# Patient Record
Sex: Female | Born: 1995 | Race: Black or African American | Hispanic: No | Marital: Single | Smoking: Never smoker
Health system: Southern US, Community
[De-identification: ages and names within clinical notes are randomized; demographics above are authoritative.]

## PROBLEM LIST (undated history)

## (undated) DIAGNOSIS — K5900 Constipation, unspecified: Secondary | ICD-10-CM

## (undated) DIAGNOSIS — J45909 Unspecified asthma, uncomplicated: Secondary | ICD-10-CM

## (undated) DIAGNOSIS — E669 Obesity, unspecified: Secondary | ICD-10-CM

## (undated) HISTORY — DX: Obesity, unspecified: E66.9

---
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
## 2014-06-28 ENCOUNTER — Emergency Department (HOSPITAL_BASED_OUTPATIENT_CLINIC_OR_DEPARTMENT_OTHER): Payer: Medicaid Other

## 2014-06-28 ENCOUNTER — Emergency Department (HOSPITAL_BASED_OUTPATIENT_CLINIC_OR_DEPARTMENT_OTHER)
Admission: EM | Admit: 2014-06-28 | Discharge: 2014-06-28 | Disposition: A | Discharge status: Home / Self Care | Payer: Medicaid Other | Attending: Emergency Medicine | Admitting: Emergency Medicine

## 2014-06-28 ENCOUNTER — Encounter (HOSPITAL_BASED_OUTPATIENT_CLINIC_OR_DEPARTMENT_OTHER): Payer: Self-pay | Admitting: Emergency Medicine

## 2014-06-28 DIAGNOSIS — E669 Obesity, unspecified: Secondary | ICD-10-CM | POA: Diagnosis not present

## 2014-06-28 DIAGNOSIS — J45909 Unspecified asthma, uncomplicated: Secondary | ICD-10-CM | POA: Insufficient documentation

## 2014-06-28 DIAGNOSIS — Z79899 Other long term (current) drug therapy: Secondary | ICD-10-CM | POA: Diagnosis not present

## 2014-06-28 DIAGNOSIS — R079 Chest pain, unspecified: Secondary | ICD-10-CM | POA: Diagnosis not present

## 2014-06-28 HISTORY — DX: Unspecified asthma, uncomplicated: J45.909

## 2014-06-28 MED ORDER — OMEPRAZOLE 20 MG PO CPDR: 20.0000 mg | DELAYED_RELEASE_CAPSULE | Freq: Every day | ORAL | Status: AC

## 2014-06-28 NOTE — ED Notes (Signed)
Pt reports (R) sided CP.  Has has hx of same-has been evaluated multiple times with no explanation of pain.  Denies N/V.  Reports SOB

## 2014-06-28 NOTE — Discharge Instructions (Signed)

## 2014-06-28 NOTE — ED Provider Notes (Signed)
CSN: [id number]     Arrival date & time 06/28/14  2024 History   First MD Initiated Contact with Patient 06/28/14 2142     Chief Complaint  Patient presents with  . Chest Pain     (Consider location/radiation/quality/duration/timing/severity/associated sxs/prior Treatment) Patient is a 18 y.o. female presenting with chest pain. The history is provided by the patient. No language interpreter was used.  Chest Pain Pain quality: sharp   Pain radiates to the back: no   Relieved by:  Aspirin Associated symptoms: no abdominal pain, no cough, no fever, no nausea, no shortness of breath and not vomiting   Associated symptoms comment:  Chest pain she describes as sharp, brief, fleeting pain that changes locations in her chest. No radiation outside the chest. Symptoms started today, however, she reports months of similar symptoms intermittently. No cough, SOB, fever, nausea. She cannot identify any pattern of aggravating factors.    Past Medical History  Diagnosis Date  . Obesity   . Asthma    History reviewed. No pertinent past surgical history. Family History  Problem Relation Age of Onset  . Asthma Other   . Vision loss Other    History  Substance Use Topics  . Smoking status: Never Smoker   . Smokeless tobacco: Not on file  . Alcohol Use: No   OB History   Grav Para Term Preterm Abortions TAB SAB Ect Mult Living                 Review of Systems  Constitutional: Negative for fever and chills.  HENT: Negative.   Respiratory: Negative.  Negative for cough and shortness of breath.   Cardiovascular: Positive for chest pain.  Gastrointestinal: Negative.  Negative for nausea, vomiting and abdominal pain.  Musculoskeletal: Negative.  Negative for myalgias.  Skin: Negative.   Neurological: Negative.       Allergies  Shellfish allergy and Wheat bran  Home Medications   Prior to Admission medications   Medication Sig Start Date End Date Taking? Authorizing Provider   ALBUTEROL IN Inhale into the lungs as needed.    Historical Provider, MD   BP 129/58  Pulse 70  Temp(Src) 98.4 F (36.9 C) (Oral)  Resp 18  Ht 5\' 4"  (1.626 m)  Wt 250 lb (113.399 kg)  BMI 42.89 kg/m2  SpO2 98%  LMP 06/14/2014 Physical Exam  Constitutional: She is oriented to person, place, and time. She appears well-developed and well-nourished.  HENT:  Head: Normocephalic.  Neck: Normal range of motion. Neck supple.  Cardiovascular: Normal rate and regular rhythm.   Pulmonary/Chest: Effort normal and breath sounds normal. She has no wheezes. She has no rales. She exhibits no tenderness.  Abdominal: Soft. Bowel sounds are normal. There is no tenderness. There is no rebound and no guarding.  Musculoskeletal: Normal range of motion.  Neurological: She is alert and oriented to person, place, and time.  Skin: Skin is warm and dry. No rash noted.  Psychiatric: She has a normal mood and affect.    ED Course  Procedures (including critical care time) Labs Review Labs Reviewed - No data to display  Imaging Review No results found.   EKG Interpretation   Date/Time:  Wednesday June 28 2014 20:42:31 EDT Ventricular Rate:  82 PR Interval:  130 QRS Duration: 86 QT Interval:  360 QTC Calculation: 420 R Axis:   72 Text Interpretation:  Normal sinus rhythm Normal ECG No old tracing to  compare Confirmed by GOLDSTON  MD, SCOTT (  [id number]) on 06/28/2014 8:53:03 PM      MDM   Final diagnoses:  None    1. Chest pain, intermittent, chronic  No symptoms concerning for MI, PE or PNA. Neg EKG and CXR. Symptoms longstanding. Will refer back to PCP for further outpatient evaluation.    Arnoldo Hooker, PA-C 06/28/14 2228

## 2014-06-28 NOTE — ED Notes (Signed)
C/o rt side cp x 1.5 hours  Increased w laughing, and deep breath   Has been seen for same

## 2014-06-29 NOTE — ED Provider Notes (Signed)
Medical screening examination/treatment/procedure(s) were performed by non-physician practitioner and as supervising physician I was immediately available for consultation/collaboration.   EKG Interpretation   Date/Time:  Wednesday June 28 2014 20:42:31 EDT Ventricular Rate:  82 PR Interval:  130 QRS Duration: 86 QT Interval:  360 QTC Calculation: 420 R Axis:   72 Text Interpretation:  Normal sinus rhythm Normal ECG No old tracing to  compare Confirmed by Khylen Riolo  MD, Jaslynn Thome ([id number]) on 06/28/2014 8:53:03 PM        Audree CamelScott T Bonnye Halle, MD 06/29/14 [phone]

## 2015-02-15 ENCOUNTER — Encounter (HOSPITAL_COMMUNITY): Payer: Self-pay | Admitting: Emergency Medicine

## 2015-02-15 ENCOUNTER — Emergency Department (HOSPITAL_COMMUNITY): Payer: Medicaid Other

## 2015-02-15 DIAGNOSIS — Z8739 Personal history of other diseases of the musculoskeletal system and connective tissue: Secondary | ICD-10-CM

## 2015-02-15 DIAGNOSIS — R079 Chest pain, unspecified: Principal | ICD-10-CM

## 2015-02-15 DIAGNOSIS — J45909 Unspecified asthma, uncomplicated: Secondary | ICD-10-CM

## 2015-02-15 LAB — I-STAT TROPONIN, ED: TROPONIN I, POC: 0 ng/mL (ref 0.00–0.08)

## 2015-02-15 NOTE — ED Notes (Addendum)
Pt. reports central chest tightness with left jaw pain/mild SOB  and back pain onset this week . Denies emesis or diaphoresis . No cough or congestion .

## 2015-02-16 ENCOUNTER — Emergency Department (HOSPITAL_COMMUNITY)
Admission: EM | Admit: 2015-02-16 | Discharge: 2015-02-16 | Disposition: A | Discharge status: Home / Self Care | Payer: Medicaid Other | Attending: Emergency Medicine | Admitting: Emergency Medicine

## 2015-02-16 DIAGNOSIS — R0789 Other chest pain: Secondary | ICD-10-CM

## 2015-02-16 HISTORY — DX: Unspecified asthma, uncomplicated: J45.909

## 2015-02-16 LAB — BASIC METABOLIC PANEL
Anion gap: 6 (ref 5–15)
BUN: 8 mg/dL (ref 6–20)
CO2: 25 mmol/L (ref 22–32)
CREATININE: 1.01 mg/dL — AB (ref 0.44–1.00)
Calcium: 9.2 mg/dL (ref 8.9–10.3)
Chloride: 104 mmol/L (ref 101–111)
GFR calc Af Amer: >60 mL/min (ref >60)
GLUCOSE: 91 mg/dL (ref 65–99)
Potassium: 3.5 mmol/L (ref 3.5–5.1)
Sodium: 135 mmol/L (ref 135–145)

## 2015-02-16 LAB — CBC
HCT: 36.8 % (ref 36.0–46.0)
Hemoglobin: 12.6 g/dL (ref 12.0–15.0)
MCH: 29.4 pg (ref 26.0–34.0)
MCHC: 34.2 g/dL (ref 30.0–36.0)
MCV: 86 fL (ref 78.0–100.0)
PLATELETS: 281 10*3/uL (ref 150–400)
RBC: 4.28 MIL/uL (ref 3.87–5.11)
RDW: 13.8 % (ref 11.5–15.5)
WBC: 7.9 10*3/uL (ref 4.0–10.5)

## 2015-02-16 LAB — BRAIN NATRIURETIC PEPTIDE: B NATRIURETIC PEPTIDE 5: 22.1 pg/mL (ref 0.0–100.0)

## 2015-02-16 NOTE — Discharge Instructions (Signed)
Chest Pain (Nonspecific) Andrea Osborne, your chest xray, EKG, and heart markers were all normal.  See a primary care doctor within 3 days for your chest pain.  If symptoms worsen, come back to the ED immediately.  Thank you. It is often hard to give a diagnosis for the cause of chest pain. There is always a chance that your pain could be related to something serious, such as a heart attack or a blood clot in the lungs. You need to follow up with your doctor. HOME CARE  If antibiotic medicine was given, take it as directed by your doctor. Finish the medicine even if you start to feel better.  For the next few days, avoid activities that bring on chest pain. Continue physical activities as told by your doctor.  Do not use any tobacco products. This includes cigarettes, chewing tobacco, and e-cigarettes.  Avoid drinking alcohol.  Only take medicine as told by your doctor.  Follow your doctor's suggestions for more testing if your chest pain does not go away.  Keep all doctor visits you made. GET HELP IF:  Your chest pain does not go away, even after treatment.  You have a rash with blisters on your chest.  You have a fever. GET HELP RIGHT AWAY IF:   You have more pain or pain that spreads to your arm, neck, jaw, back, or belly (abdomen).  You have shortness of breath.  You cough more than usual or cough up blood.  You have very bad back or belly pain.  You feel sick to your stomach (nauseous) or throw up (vomit).  You have very bad weakness.  You pass out (faint).  You have chills. This is an emergency. Do not wait to see if the problems will go away. Call your local emergency services (911 in [place] Do not drive yourself to the hospital. MAKE SURE YOU:   Understand these instructions.  Will watch your condition.  Will get help right away if you are not doing well or get worse. Document Released: 02/25/2008 Document Revised: 09/13/2013 Document Reviewed: 02/25/2008 [facility]  Patient Information 2015 [place], [place]. This information is not intended to replace advice given to you by your health care provider. Make sure you discuss any questions you have with your health care provider.

## 2015-02-16 NOTE — ED Notes (Signed)
Oni, MD at bedside.  

## 2015-02-16 NOTE — ED Provider Notes (Signed)
CSN: [id number]     Arrival date & time 02/15/15  2308 History   This chart was scribed for Tomasita CrumbleAdeleke Deshonna Trnka, MD by Freida Busmaniana Omoyeni, ED Scribe. This patient was seen in room D33C/D33C and the patient's care was started 12:50 AM.    Chief Complaint  Patient presents with  . Chest Pain   The history is provided by the patient. No language interpreter was used.     HPI Comments:  Andrea Osborne is a 19 y.o. female who presents to the Emergency Department complaining of constant CP that started 3 days ago. Andrea Osborne notes the pain starts in her central chest and radiates under bilateral breasts. Andrea Osborne describes her pain as a tightness. Andrea Osborne reports a h/o CP but notes the tightness is new. Her pain is worse at night when laying flat and has also been exacerbated by deep inspiration. Andrea Osborne denies burning sensation in her chest,  h/o blood clots, recent surgery/long periods of immobilization, use of BCP/hormone replacements. With past CP Andrea Osborne has been diagnosed with costochondritis. Andrea Osborne denies SOB, vomiting, diaphoresis, diarrhea, and abdominal pain. No alleviating factors noted.  Past Medical History  Diagnosis Date  . Asthma    History reviewed. No pertinent past surgical history. No family history on file. History  Substance Use Topics  . Smoking status: Never Smoker   . Smokeless tobacco: Not on file  . Alcohol Use: No   OB History    No data available     Review of Systems  A complete 10 system review of systems was obtained and all systems are negative except as noted in the HPI and PMH.    Allergies  Review of patient's allergies indicates no known allergies.  Home Medications   Prior to Admission medications   Not on File   BP 126/70 mmHg  Pulse 76  Temp(Src) 98 F (36.7 C) (Oral)  Resp 14  Ht 5\' 4"  (1.626 m)  Wt 249 lb (112.946 kg)  BMI 42.72 kg/m2  SpO2 100%  LMP 02/13/2015 Physical Exam  Constitutional: Andrea Osborne is oriented to person, place, and time. Andrea Osborne appears  well-developed and well-nourished. No distress.  HENT:  Head: Normocephalic and atraumatic.  Nose: Nose normal.  Mouth/Throat: Oropharynx is clear and moist. No oropharyngeal exudate.  Eyes: Conjunctivae and EOM are normal. Pupils are equal, round, and reactive to light. No scleral icterus.  Neck: Normal range of motion. Neck supple. No JVD present. No tracheal deviation present. No thyromegaly present.  Cardiovascular: Normal rate, regular rhythm and normal heart sounds.  Exam reveals no gallop and no friction rub.   No murmur heard. Pulmonary/Chest: Effort normal and breath sounds normal. No respiratory distress. Andrea Osborne has no wheezes. Andrea Osborne exhibits no tenderness.  Abdominal: Soft. Bowel sounds are normal. Andrea Osborne exhibits no distension and no mass. There is no tenderness. There is no rebound and no guarding.  Musculoskeletal: Normal range of motion. Andrea Osborne exhibits no edema or tenderness.  Lymphadenopathy:    Andrea Osborne has no cervical adenopathy.  Neurological: Andrea Osborne is alert and oriented to person, place, and time. No cranial nerve deficit. Andrea Osborne exhibits normal muscle tone.  Skin: Skin is warm and dry. No rash noted. No erythema. No pallor.  Nursing note and vitals reviewed.      ED Course  Procedures  DIAGNOSTIC STUDIES:  Oxygen Saturation is 100% on RA, normal by my interpretation.    COORDINATION OF CARE:  12:54 AM Pt updated with results. Advised pt to f/u with PCP and to try  zantac as Andrea Osborne may be experiencing GERD.  Discussed treatment plan with pt at bedside and pt agreed to plan.  Labs Review Labs Reviewed  BASIC METABOLIC PANEL - Abnormal; Notable for the following:    Creatinine, Ser 1.01 (*)    All other components within normal limits  CBC  BRAIN NATRIURETIC PEPTIDE  I-STAT TROPOININ, ED    Imaging Review Dg Chest 2 View  02/16/2015   CLINICAL DATA:  Chest tightness which began-today. History of asthma.  EXAM: CHEST  2 VIEW  COMPARISON:  None.  FINDINGS: Normal cardiac silhouette  and mediastinal contours. No focal parenchymal opacities. No pleural effusion or pneumothorax. No evidence of edema. No acute osseus abnormalities.  IMPRESSION: No acute cardiopulmonary disease.   Electronically Signed   By: Simonne Come M.D.   On: 02/16/2015 01:11     EKG Interpretation   Date/Time:  Thursday Feb 15 2015 23:22:03 EDT Ventricular Rate:  67 PR Interval:  134 QRS Duration: 84 QT Interval:  382 QTC Calculation: 403 R Axis:   75 Text Interpretation:  Normal sinus rhythm with sinus arrhythmia Normal ECG  Confirmed by Erroll Luna [phone]) on 02/16/2015 12:46:15 AM      MDM   Final diagnoses:  Chest tightness    Patient presents emergency department for chest pain. Andrea Osborne states it is worse at night when laying down. This raises the suspicion for possible reflux as the cause of her pain. I do not believe her history is consistent with ACS. Her heart score is less than 3. EKG is nonischemic.  Chest xray is normal.  PERC criteria is negative and Andrea Osborne has no risk factors for pulmonary embolism. Patient was advised to try Zantac at home and see her primary care physician within 3 days for close follow-up. Andrea Osborne otherwise appears well in no acute distress. Her vital signs remain within her normal limits and Andrea Osborne is safe for discharge.  I personally performed the services described in this documentation, which was scribed in my presence. The recorded information has been reviewed and is accurate.   Tomasita Crumble, MD 02/16/15 [phone]

## 2015-08-10 ENCOUNTER — Emergency Department (HOSPITAL_COMMUNITY): Payer: Medicaid Other

## 2015-08-10 ENCOUNTER — Emergency Department (HOSPITAL_COMMUNITY)
Admission: EM | Admit: 2015-08-10 | Discharge: 2015-08-10 | Disposition: A | Discharge status: Home / Self Care | Payer: Medicaid Other | Attending: Emergency Medicine | Admitting: Emergency Medicine

## 2015-08-10 ENCOUNTER — Encounter (HOSPITAL_COMMUNITY): Payer: Self-pay | Admitting: Emergency Medicine

## 2015-08-10 DIAGNOSIS — Z79899 Other long term (current) drug therapy: Secondary | ICD-10-CM

## 2015-08-10 DIAGNOSIS — J45909 Unspecified asthma, uncomplicated: Secondary | ICD-10-CM

## 2015-08-10 DIAGNOSIS — R1013 Epigastric pain: Principal | ICD-10-CM

## 2015-08-10 DIAGNOSIS — Z3202 Encounter for pregnancy test, result negative: Secondary | ICD-10-CM

## 2015-08-10 DIAGNOSIS — R109 Unspecified abdominal pain: Secondary | ICD-10-CM | POA: Diagnosis present

## 2015-08-10 HISTORY — DX: Constipation, unspecified: K59.00

## 2015-08-10 LAB — I-STAT CHEM 8, ED
BUN: 9 mg/dL (ref 6–20)
CALCIUM ION: 1.23 mmol/L (ref 1.12–1.23)
Chloride: 104 mmol/L (ref 101–111)
Creatinine, Ser: 1 mg/dL (ref 0.44–1.00)
GLUCOSE: 88 mg/dL (ref 65–99)
HCT: 44 % (ref 36.0–46.0)
Hemoglobin: 15 g/dL (ref 12.0–15.0)
Potassium: 4.1 mmol/L (ref 3.5–5.1)
Sodium: 140 mmol/L (ref 135–145)
TCO2: 26 mmol/L (ref 0–100)

## 2015-08-10 LAB — POC URINE PREG, ED: Preg Test, Ur: NEGATIVE

## 2015-08-10 MED ORDER — POLYETHYLENE GLYCOL 3350 17 G PO PACK: 17.0000 g | PACK | Freq: Every day | ORAL | Status: AC

## 2015-08-10 MED ORDER — GI COCKTAIL ~~LOC~~
30.0000 mL | Freq: Once | ORAL | Status: AC
  Administered 2015-08-10: 30 mL via ORAL
  Filled 2015-08-10: qty 30

## 2015-08-10 NOTE — ED Notes (Signed)
Pt IS ALLERGIC TO WHEAT-- HAS AN EPI PEN. IS ON GLUTEN FREE DIET.

## 2015-08-10 NOTE — ED Notes (Signed)
Gave pt gingerale and apple sauce for PO challenge. Pt reports abdominal pain is easing up and "it feels like whatever it was is moving down."

## 2015-08-10 NOTE — Discharge Instructions (Signed)
Please follow up with your doctor for further evaluation of your abdominal pain.  Return if you develop fever, persistent vomiting, bloody bowel movement, or trouble breathing.    Abdominal Pain, Adult Many things can cause abdominal pain. Usually, abdominal pain is not caused by a disease and will improve without treatment. It can often be observed and treated at home. Your health care provider will do a physical exam and possibly order blood tests and X-rays to help determine the seriousness of your pain. However, in many cases, more time must pass before a clear cause of the pain can be found. Before that point, your health care provider may not know if you need more testing or further treatment. HOME CARE INSTRUCTIONS Monitor your abdominal pain for any changes. The following actions may help to alleviate any discomfort you are experiencing:  Only take over-the-counter or prescription medicines as directed by your health care provider.  Do not take laxatives unless directed to do so by your health care provider.  Try a clear liquid diet (broth, tea, or water) as directed by your health care provider. Slowly move to a bland diet as tolerated. SEEK MEDICAL CARE IF:  You have unexplained abdominal pain.  You have abdominal pain associated with nausea or diarrhea.  You have pain when you urinate or have a bowel movement.  You experience abdominal pain that wakes you in the night.  You have abdominal pain that is worsened or improved by eating food.  You have abdominal pain that is worsened with eating fatty foods.  You have a fever. SEEK IMMEDIATE MEDICAL CARE IF:  Your pain does not go away within 2 hours.  You keep throwing up (vomiting).  Your pain is felt only in portions of the abdomen, such as the right side or the left lower portion of the abdomen.  You pass bloody or black tarry stools. MAKE SURE YOU:  Understand these instructions.  Will watch your condition.  Will  get help right away if you are not doing well or get worse.   This information is not intended to replace advice given to you by your health care provider. Make sure you discuss any questions you have with your health care provider.   Document Released: 06/18/2005 Document Revised: 05/30/2015 Document Reviewed: 05/18/2013 Else[employer] Interactive Patient Education [employer]2016 [employer]

## 2015-08-10 NOTE — ED Provider Notes (Signed)
CSN: [id number]     Arrival date & time 08/10/15  1114 History   First MD Initiated Contact with Patient 08/10/15 1138     No chief complaint on file.    (Consider location/radiation/quality/duration/timing/severity/associated sxs/prior Treatment) HPI   19 year old female presents with epigastric abdominal pain. Patient states for nearly a week she has had intermittent pain to her epigastric pain which she described as a sharp pressure sensation lasting for seconds and has been recurrent. Nothing seems to make it better or worse. Denies any associated pain with eating or with breathing. No complaints of fever, chills, lightheadedness, dizziness, chest pain, shortness of breath, productive cough, hemoptysis, back pain, dysuria, hematuria, or rash. She denies any injury. She does not have any significant cardiac history or any strong family history of cardiac disease. She is a nonsmoker. Her pain is mild to moderate at this time. Denies alcohol abuse.  Past Medical History  Diagnosis Date  . Asthma    No past surgical history on file. No family history on file. Social History  Substance Use Topics  . Smoking status: Never Smoker   . Smokeless tobacco: Not on file  . Alcohol Use: No   OB History    No data available     Review of Systems  All other systems reviewed and are negative.     Allergies  Wheat bran and Shellfish allergy  Home Medications   Prior to Admission medications   Medication Sig Start Date End Date Taking? Authorizing Provider  albuterol (PROVENTIL HFA;VENTOLIN HFA) 108 (90 BASE) MCG/ACT inhaler Inhale 2 puffs into the lungs every 6 (six) hours as needed for wheezing or shortness of breath.    Historical Provider, MD  EPINEPHrine 0.3 mg/0.3 mL IJ SOAJ injection Inject 0.3 mg into the muscle daily as needed (allergic reaction).    Historical Provider, MD   There were no vitals taken for this visit. Physical Exam  Constitutional: She appears well-developed  and well-nourished. No distress.  HENT:  Head: Atraumatic.  Mouth/Throat: Oropharynx is clear and moist.  Eyes: Conjunctivae are normal.  Neck: Neck supple.  Cardiovascular: Normal rate, regular rhythm and intact distal pulses.   Pulmonary/Chest: Effort normal and breath sounds normal.  Abdominal: Soft. Bowel sounds are normal. She exhibits no distension. There is tenderness (Mild epigastric tenderness without guarding or rebound tenderness. Negative Murphy sign, no pain at McBurney's point).  Neurological: She is alert.  Skin: No rash noted.  Psychiatric: She has a normal mood and affect.  Nursing note and vitals reviewed.   ED Course  Procedures (including critical care time)  Well. African-American female here with reproducible epigastric tenderness. She is afebrile with stable normal vital sign. Pain is atypical for ACS. She is PERC negative, doubt PE.  No hx of alcohol abuse or diabetes, doubt biliary disease or pancreatitis.  Has hx of constipation as a child.  LBM was yesterday, small amount.  She's able to pass flatus.  Abdomen is non distended.  No prior abd surgery.  Therefore, i have low suspicion for SBO.    2:16 PM Patient currently tolerates by mouth without difficulty. No abdominal pain at this time. Workup unremarkable. Patient stable for discharge and will follow-up closely with PCP for further care. Return precautions discussed.  Labs Review Labs Reviewed  CBC WITH DIFFERENTIAL/PLATELET  POC URINE PREG, ED  I-STAT CHEM 8, ED    Imaging Review Dg Chest 2 View  08/10/2015  CLINICAL DATA:  Upper abdominal pain and nausea. EXAM:  CHEST  2 VIEW COMPARISON:  02/15/2015 FINDINGS: The heart size and mediastinal contours are within normal limits. Both lungs are clear. The visualized skeletal structures are unremarkable. No free air under the diaphragm. No visible dilated bowel in the upper abdomen. IMPRESSION: Normal exam. Electronically Signed   By: Francene Boyers M.D.   On:  08/10/2015 13:42   I have personally reviewed and evaluated these images and lab results as part of my medical decision-making.   EKG Interpretation None      Date: 08/10/2015  Rate: 73  Rhythm: normal sinus rhythm  QRS Axis: normal  Intervals: normal  ST/T Wave abnormalities: normal  Conduction Disutrbances: none  Narrative Interpretation:   Old EKG Reviewed: No significant changes noted EKG reviewed by me.    MDM   Final diagnoses:  Epigastric pain    BP 119/62 mmHg  Pulse 73  Temp(Src) 98.4 F (36.9 C) (Oral)  Resp 16  Ht  (1.651 m)  Wt 240 lb (108.863 kg)  BMI 39.94 kg/m2  SpO2 100%  LMP 07/25/2015     Fayrene Helper, PA-C 08/10/15 1417  Alvira Monday, MD 08/12/15 1930

## 2015-08-10 NOTE — ED Notes (Signed)
To ED via private vehicle with c/o epigastric pain-- pt has a hx of constipation since age 19. Has had some constipation last week-- used a laxative with some relief. Had a bowel movement yesterday- but states it was harder than normal.

## 2016-09-21 ENCOUNTER — Encounter (HOSPITAL_COMMUNITY): Payer: Self-pay | Admitting: *Deleted

## 2016-09-21 ENCOUNTER — Emergency Department (HOSPITAL_COMMUNITY): Payer: Self-pay

## 2016-09-21 ENCOUNTER — Other Ambulatory Visit: Payer: Self-pay

## 2016-09-21 ENCOUNTER — Emergency Department (HOSPITAL_COMMUNITY)
Admission: EM | Admit: 2016-09-21 | Discharge: 2016-09-21 | Disposition: A | Discharge status: Home / Self Care | Payer: Self-pay | Attending: Emergency Medicine | Admitting: Emergency Medicine

## 2016-09-21 DIAGNOSIS — N12 Tubulo-interstitial nephritis, not specified as acute or chronic: Secondary | ICD-10-CM

## 2016-09-21 DIAGNOSIS — J45909 Unspecified asthma, uncomplicated: Secondary | ICD-10-CM

## 2016-09-21 DIAGNOSIS — J029 Acute pharyngitis, unspecified: Principal | ICD-10-CM

## 2016-09-21 LAB — CBC WITH DIFFERENTIAL/PLATELET
BASOS ABS: 0 10*3/uL (ref 0.0–0.1)
BASOS PCT: 0 %
Eosinophils Absolute: 0.2 10*3/uL (ref 0.0–0.7)
Eosinophils Relative: 2 %
HEMATOCRIT: 39.3 % (ref 36.0–46.0)
HEMOGLOBIN: 13.8 g/dL (ref 12.0–15.0)
Lymphocytes Relative: 45 %
Lymphs Abs: 3.3 10*3/uL (ref 0.7–4.0)
MCH: 29.5 pg (ref 26.0–34.0)
MCHC: 35.1 g/dL (ref 30.0–36.0)
MCV: 84 fL (ref 78.0–100.0)
Monocytes Absolute: 0.4 10*3/uL (ref 0.1–1.0)
Monocytes Relative: 5 %
NEUTROS ABS: 3.5 10*3/uL (ref 1.7–7.7)
NEUTROS PCT: 48 %
Platelets: 322 10*3/uL (ref 150–400)
RBC: 4.68 MIL/uL (ref 3.87–5.11)
RDW: 13.3 % (ref 11.5–15.5)
WBC: 7.4 10*3/uL (ref 4.0–10.5)

## 2016-09-21 LAB — BASIC METABOLIC PANEL
ANION GAP: 12 (ref 5–15)
BUN: 9 mg/dL (ref 6–20)
CALCIUM: 10 mg/dL (ref 8.9–10.3)
CO2: 21 mmol/L — ABNORMAL LOW (ref 22–32)
Chloride: 108 mmol/L (ref 101–111)
Creatinine, Ser: 1.12 mg/dL — ABNORMAL HIGH (ref 0.44–1.00)
Glucose, Bld: 87 mg/dL (ref 65–99)
POTASSIUM: 3.4 mmol/L — AB (ref 3.5–5.1)
Sodium: 141 mmol/L (ref 135–145)

## 2016-09-21 LAB — URINALYSIS, ROUTINE W REFLEX MICROSCOPIC
Bilirubin Urine: NEGATIVE
Glucose, UA: NEGATIVE mg/dL
Hgb urine dipstick: NEGATIVE
Ketones, ur: NEGATIVE mg/dL
NITRITE: POSITIVE — AB
PROTEIN: NEGATIVE mg/dL
SPECIFIC GRAVITY, URINE: 1.014 (ref 1.005–1.030)
SQUAMOUS EPITHELIAL / LPF: NONE SEEN
pH: 5 (ref 5.0–8.0)

## 2016-09-21 LAB — I-STAT TROPONIN, ED: Troponin i, poc: 0 ng/mL (ref 0.00–0.08)

## 2016-09-21 LAB — RAPID STREP SCREEN (MED CTR MEBANE ONLY): Streptococcus, Group A Screen (Direct): NEGATIVE

## 2016-09-21 LAB — MONONUCLEOSIS SCREEN: MONO SCREEN: NEGATIVE

## 2016-09-21 LAB — PREGNANCY, URINE: PREG TEST UR: NEGATIVE

## 2016-09-21 MED ORDER — ACETAMINOPHEN 325 MG PO TABS
650.0000 mg | ORAL_TABLET | Freq: Once | ORAL | Status: AC
  Administered 2016-09-21: 650 mg via ORAL
  Filled 2016-09-21: qty 2

## 2016-09-21 MED ORDER — LIDOCAINE VISCOUS 2 % MT SOLN
15.0000 mL | Freq: Once | OROMUCOSAL | Status: AC
  Administered 2016-09-21: 15 mL via OROMUCOSAL
  Filled 2016-09-21: qty 15

## 2016-09-21 MED ORDER — CIPROFLOXACIN HCL 500 MG PO TABS: 500.0000 mg | ORAL_TABLET | Freq: Two times a day (BID) | ORAL | 0 refills | Status: AC

## 2016-09-21 MED ORDER — CIPROFLOXACIN IN D5W 400 MG/200ML IV SOLN
400.0000 mg | Freq: Once | INTRAVENOUS | Status: AC
  Administered 2016-09-21: 400 mg via INTRAVENOUS
  Filled 2016-09-21: qty 200

## 2016-09-21 MED ORDER — SODIUM CHLORIDE 0.9 % IV BOLUS (SEPSIS)
500.0000 mL | Freq: Once | INTRAVENOUS | Status: AC
  Administered 2016-09-21: 500 mL via INTRAVENOUS

## 2016-09-21 NOTE — ED Notes (Signed)
Called PT to be taken to room, no answer..Marland Kitchen

## 2016-09-21 NOTE — ED Provider Notes (Signed)
[facility]-EMERGENCY DEPT Provider Note   CSN: [id number] Arrival date & time: 09/21/16  1617  By signing my name below, I, Orpah CobbMaurice Copeland, attest that this documentation has been prepared under the direction and in the presence of Arvilla MeresAshley Meyer, PA-C. Electronically Signed: Orpah CobbMaurice Copeland , ED Scribe. 09/21/16. 8:49 PM.    History   Chief Complaint Chief Complaint  Patient presents with  . Sore Throat  . Chest Pain    HPI  HPI Comments: Andrea Osborne is a 20 y.o. female with hx of asthma who presents to the Emergency Department complaining of mild to moderate sore throat with sudden onset x5 days. Pt states that for the past 5 days she has felt that her tonsils have swelled and it has become difficult to swallow. She is managing her oral secretions and able to tolerate food and fluids. Pt reports associated headache, nasal congestion (resolved), and sick contacts at work. Pt has drank hot tea with mild relief. She also reports bilateral lower back pain and associated urinary frequency that she suspects is associated with a recent UTI. She has taken AZO tablets with relief of these symptoms. She also reported episode of chest tightness last night that has since resolved. Pt denies painful swallowing, neck pain with ROM, fever, chills, congestion, rhinorrhea, cough, hemoptysis, LOC, SOB, leg swelling/pain, wheezing, numbness, weakness, bladder/bowel incontinence, nausea, vomiting, abdominal pain, vaginal bleeding, hematuria, dysuria, pelvic pain. Pt denies recent trauma, hx of mononucleosis, recent long distance travel, hx of cancer, h/o IVDU, h/o blood clot, and contraceptive use.    The history is provided by the patient. No language interpreter was used.    Past Medical History:  Diagnosis Date  . Asthma   . Constipation     There are no active problems to display for this patient.   History reviewed. No pertinent surgical history.  OB History    No data available         Home Medications    Prior to Admission medications   Medication Sig Start Date End Date Taking? Authorizing Provider  albuterol (PROVENTIL HFA;VENTOLIN HFA) 108 (90 BASE) MCG/ACT inhaler Inhale 2 puffs into the lungs every 6 (six) hours as needed for wheezing or shortness of breath.    Historical Provider, MD  cetirizine (ZYRTEC) 10 MG tablet Take 10 mg by mouth daily.    Historical Provider, MD  ciprofloxacin (CIPRO) 500 MG tablet Take 1 tablet (500 mg total) by mouth 2 (two) times daily. 09/21/16   Lona KettleAshley Laurel Meyer, PA-C  EPINEPHrine 0.3 mg/0.3 mL IJ SOAJ injection Inject 0.3 mg into the muscle daily as needed (allergic reaction).    Historical Provider, MD  polyethylene glycol (MIRALAX / GLYCOLAX) packet Take 17 g by mouth daily. 08/10/15   Fayrene HelperBowie Tran, PA-C    Family History History reviewed. No pertinent family history.  Social History Social History  Substance Use Topics  . Smoking status: Never Smoker  . Smokeless tobacco: Not on file  . Alcohol use No     Allergies   Wheat bran and Shellfish allergy   Review of Systems Review of Systems  Constitutional: Negative for chills.  HENT: Positive for sore throat and trouble swallowing. Negative for congestion and rhinorrhea.   Eyes: Negative for visual disturbance.  Respiratory: Positive for chest tightness. Negative for wheezing.   Gastrointestinal: Negative for abdominal pain, nausea and vomiting.  Genitourinary: Positive for frequency ( resolved). Negative for dysuria, hematuria, vaginal bleeding, vaginal discharge and vaginal pain.  Musculoskeletal: Negative  for neck pain.  Skin: Negative for rash.  Allergic/Immunologic: Negative for immunocompromised state.  Neurological: Negative for syncope.     Physical Exam Updated Vital Signs BP 140/66 (BP Location: Left Arm)   Pulse 81   Temp 98.2 F (36.8 C) (Oral)   Resp 18   SpO2 100%   Physical Exam  Constitutional: She is oriented to person, place, and  time. She appears well-developed and well-nourished. No distress.  HENT:  Head: Normocephalic and atraumatic.  Right Ear: Tympanic membrane, external ear and ear canal normal.  Left Ear: Tympanic membrane, external ear and ear canal normal.  Nose: Mucosal edema ( right) and rhinorrhea present.  Mouth/Throat: Uvula is midline and mucous membranes are normal. No trismus in the jaw. No uvula swelling. No posterior oropharyngeal erythema. Tonsillar exudate.  No trismus. Mild posterior oropharynx erythema without edema. Mild bilateral tonsillar hypertrophy with exudate. Uvula is midline and rises symmetrically. Pt is managing oral secretions.   Eyes: Conjunctivae and EOM are normal. Pupils are equal, round, and reactive to light. Right eye exhibits no discharge. Left eye exhibits no discharge. No scleral icterus.  Neck: Normal range of motion and phonation normal. Neck supple. No neck rigidity. Normal range of motion present.  No nuchal rigidity. Neck ROM intact.   Cardiovascular: Normal rate, regular rhythm, normal heart sounds and intact distal pulses.   No murmur heard. Pulmonary/Chest: Effort normal and breath sounds normal. No stridor. No respiratory distress. She has no wheezes. She has no rales.  Symmetric chest expansion. Respirations unlabored. No wheezing or rales. No hypoxia.   Abdominal: Soft. She exhibits no distension. There is no tenderness. There is no rigidity, no rebound, no guarding and no CVA tenderness.  Musculoskeletal: Normal range of motion. She exhibits no edema or tenderness.  No midline spinal tenderness. No lower extremity swelling.   Lymphadenopathy:    She has no cervical adenopathy.  Neurological: She is alert and oriented to person, place, and time. She has normal strength. She is not disoriented. No cranial nerve deficit or sensory deficit. Coordination and gait normal. GCS eye subscore is 4. GCS verbal subscore is 5. GCS motor subscore is 6.  Mental Status: Alert,  thought content appropriate, able to give a coherent history. Speech fluent without evidence of aphasia.  CN 2-12 grossly intact.  Moves extremities with ease. Sensation grossly equal and intact throughout. Strength 5/5 in all extremities.  Coordination nml with finger-to-nose b/l.  Pt ambulatory with steady gait.  Skin: Skin is warm and dry. She is not diaphoretic.  Psychiatric: She has a normal mood and affect. Her behavior is normal.  Nursing note and vitals reviewed.    ED Treatments / Results   DIAGNOSTIC STUDIES: Oxygen Saturation is 100% on RA, normal by my interpretation.   COORDINATION OF CARE: 8:49 PM-Discussed next steps with pt. Pt verbalized understanding and is agreeable with the plan.    Labs (all labs ordered are listed, but only abnormal results are displayed) Labs Reviewed  URINALYSIS, ROUTINE W REFLEX MICROSCOPIC - Abnormal; Notable for the following:       Result Value   APPearance CLOUDY (*)    Nitrite POSITIVE (*)    Leukocytes, UA LARGE (*)    Bacteria, UA FEW (*)    All other components within normal limits  BASIC METABOLIC PANEL - Abnormal; Notable for the following:    Potassium 3.4 (*)    CO2 21 (*)    Creatinine, Ser 1.12 (*)    All  other components within normal limits  RAPID STREP SCREEN (NOT AT [facility])  CULTURE, GROUP A STREP [facility])  URINE CULTURE  MONONUCLEOSIS SCREEN  PREGNANCY, URINE  CBC WITH DIFFERENTIAL/PLATELET  Rosezena Sensor, ED    EKG  EKG Interpretation None       Radiology Dg Chest 2 View  Result Date: 09/21/2016 CLINICAL DATA:  Chest tightness and shortness of breath EXAM: CHEST  2 VIEW COMPARISON:  August 10, 2015 FINDINGS: The heart size and mediastinal contours are within normal limits. Both lungs are clear. The visualized skeletal structures are unremarkable. IMPRESSION: No active cardiopulmonary disease. Electronically Signed   By: Sherian Rein M.D.   On: 09/21/2016 17:32    Procedures Procedures  (including critical care time)  Medications Ordered in ED Medications  ciprofloxacin (CIPRO) IVPB 400 mg (400 mg Intravenous New Bag/Given 09/21/16 1959)  acetaminophen (TYLENOL) tablet 650 mg (650 mg Oral Given 09/21/16 1854)  lidocaine (XYLOCAINE) 2 % viscous mouth solution 15 mL (15 mLs Mouth/Throat Given 09/21/16 1854)  sodium chloride 0.9 % bolus 500 mL (500 mLs Intravenous New Bag/Given 09/21/16 1958)     Initial Impression / Assessment and Plan / ED Course  I have reviewed the triage vital signs and the nursing notes.  Pertinent labs & imaging results that were available during my care of the patient were reviewed by me and considered in my medical decision making (see chart for details).  Clinical Course as of Sep 21 2048  Wynelle Link Sep 21, 2016  1811 DG Chest 2 View [AM]    Clinical Course User Index [AM] Lona Kettle, [place]    Patient presents to ED with multiple complaints 1)sore throat x 5 days, 2)chest tightness last night that has since resolved no SOB, wheezing, or cough, and 3) low back pain. Patient is afebrile and non-toxic appearing in NAD. VSS. Heart RRR. Lungs CTABL, no hypoxia, no stridor. Mild posterior oropharynx erythema and mild tonsillar hypertrophy and exudate. Uvula midline and rises symmetrically. No trismus. Managing oral secretions. No nuchal rigidity.   1. Sore throat: Rapid strep negative. Mono negative. No leukocytosis or atypical lymphocytes. Low suspicion for PTA or deep space infection at this time. ?viral pharyngitis. Discussed symptomatic management  Tylenol/motrin for pain relief, warm salt water gargles, and warm liquids, OTC throat lozenges.   2. Chest tightness: resolved. EKG shows sinus rhythm. CXR shows no evidence of PNA, pleural effusion, or PTX. Troponin neg. EKG shows sinus rhythm. Low suspicion for ACS. Heart score 1. Perc neg - low suspicion for PE. ?secnodary to asthma. Pt reports h/o asthma, lives in an apartment and states sxs felt  similar to asthma. Continue prescribed inhalers as directed. Follow up with PCP.  3. Back pain with urinary frequency: no midline spinal tenderness. No CVA tenderness. Abdomen benign. No neurological deficits and normal neuro exam. Patient is ambulatory. No loss of bowel or bladder control.  No concern for cauda equina.  No fever, night sweats,  h/o cancer, IVDU. U/A remarkable for UTI. Given back pain will treat as possible pyelo. VSS, no leukocytosis, no N/V, feel as though patient can be managed OP. Upreg negative. Urine culture added. Mildly elevated creatinine likely secondary to UTI. Initial IV dose ABX given. Rx ciprofloxacin. Encouraged follow up with PCP in 1-2 days for re-evaluation.   Discussed results and plan with pt. Follow up with PCP. Strict return precautions given. Pt voiced understanding and is agreeable.    Final Clinical Impressions(s) / ED Diagnoses   Final diagnoses:  Sore throat  Pyelonephritis    New Prescriptions New Prescriptions   CIPROFLOXACIN (CIPRO) 500 MG TABLET    Take 1 tablet (500 mg total) by mouth 2 (two) times daily.   I personally performed the services described in this documentation, which was scribed in my presence. The recorded information has been reviewed and is accurate.     Lona Kettleshley Laurel Meyer, [place]PA-C 09/21/16 2053    Marily MemosJason Mesner, MD 09/22/16 1226

## 2016-09-21 NOTE — ED Notes (Signed)
Pt departed in NAD, refused wheelchair.  

## 2016-09-21 NOTE — Discharge Instructions (Signed)
Read the information below.  Your rapid strep and mono were negative. The strep is being sent for culture, if positive you will be notified. You can take tylenol/motrin for pain relief. Perform warm salt water gargles. Drink warm liquids. Use OTC throat lozenges.  Your chest x-ray was re-assuring. Your EKG was re-assuring.  Your urine shows signs of infection, this may be the cause of your back pain. You are being treated for a UTI. Please take medication as directed.  Please follow up with your primary doctor in 1-2 days for re-evaluation.  Use the prescribed medication as directed.  Please discuss all new medications with your pharmacist.   You may return to the Emergency Department at any time for worsening condition or any new symptoms that concern you. Return if you develop fever, unable to open mouth, pain/difficulty moving neck, trouble swallowing, trouble breathing, unable to keep food/fluids down, abdominal pain, numbness, weakness, loss of bowel of bladder function, or any other new/concerning symptoms.

## 2016-09-21 NOTE — ED Notes (Signed)
Made two attempts to obtain IV access, both unsuccessful. Consulted with another RN for a follow-up attempt.

## 2016-09-21 NOTE — ED Triage Notes (Signed)
Pt reports sore throat x 3-4 days. White patches noted. Airway intact. Pt denies fever.

## 2016-09-24 LAB — CULTURE, GROUP A STREP (THRC)

## 2016-09-24 LAB — URINE CULTURE: Culture: >=100000 — AB

## 2016-09-25 ENCOUNTER — Telehealth (HOSPITAL_BASED_OUTPATIENT_CLINIC_OR_DEPARTMENT_OTHER): Payer: Self-pay

## 2016-09-25 NOTE — Telephone Encounter (Signed)
Post ED Visit - Positive Culture Follow-up  Culture report reviewed by antimicrobial stewardship pharmacist:  []  Enzo BiNathan Batchelder, Pharm.D. [x]  Celedonio MiyamotoJeremy Frens, Pharm.D., BCPS []  Garvin FilaMike Maccia, Pharm.D. []  Georgina PillionElizabeth Martin, Pharm.D., BCPS []  [place] Pham, [address]Pharm[address]D., BCPS, AAHIVP []  Estella HuskMichelle Turner, Pharm.D., BCPS, AAHIVP []  Tennis Mustassie Stewart, [address]Pharm[address] []  Rob Oswaldo DoneVincent, [address]Ph[address]  Positive urine culture, >/= 100,000 colonies -> Klebsiella Pneumoniae Treated with Ciprofloxacin, organism sensitive to the same and no further patient follow-up is required at this time.  Arvid RightClark, Grainger Mccarley Dorn 09/25/2016, 10:46 AM

## 2018-10-28 ENCOUNTER — Encounter: Payer: Self-pay | Admitting: Physician Assistant

## 2018-10-28 ENCOUNTER — Ambulatory Visit: Payer: Self-pay | Admitting: Physician Assistant

## 2018-10-28 VITALS — BP 110/78 | HR 79 | Temp 98.7°F | Resp 14 | Wt 281.0 lb

## 2018-10-28 DIAGNOSIS — J302 Other seasonal allergic rhinitis: Secondary | ICD-10-CM

## 2018-10-28 DIAGNOSIS — J40 Bronchitis, not specified as acute or chronic: Principal | ICD-10-CM

## 2018-10-28 MED ORDER — PROMETHAZINE-DM 6.25-15 MG/5ML PO SYRP: 5.0000 mL | ORAL_SOLUTION | Freq: Four times a day (QID) | ORAL | 0 refills | Status: AC | PRN

## 2018-10-28 MED ORDER — PREDNISONE 20 MG PO TABS: 40.0000 mg | ORAL_TABLET | Freq: Every day | ORAL | 0 refills | Status: AC

## 2018-10-28 MED ORDER — ALBUTEROL SULFATE HFA 108 (90 BASE) MCG/ACT IN AERS: 2.0000 | INHALATION_SPRAY | RESPIRATORY_TRACT | 0 refills | Status: AC | PRN

## 2018-10-28 MED ORDER — FLUTICASONE PROPIONATE 50 MCG/ACT NA SUSP: 2.0000 | Freq: Every day | NASAL | 0 refills | Status: AC

## 2018-10-28 MED ORDER — CETIRIZINE HCL 10 MG PO TABS: 10.0000 mg | ORAL_TABLET | Freq: Every day | ORAL | 0 refills | Status: AC

## 2018-10-28 NOTE — Patient Instructions (Signed)
Thank you for choosing [facility] for your health care needs.   You have been diagnosed with bronchitis (chest cold) with bronchospasm (wheezing).   Recommend increase fluids; water, Gatorade, or hot tea with water or lemon. May use humidifier or vaporizer in bedroom. Use several pillows to prop self up at night, may help with coughing and sleeping. Rest.   You have been prescribed a steroid, prednisone. 2 tablets once a day x 5 days. Take with food to prevent stomach upset. Take in the morning, can cause insomnia/difficulty sleeping.   You have been prescribed an inhaler. Use 2 puffs every 4-6 hours (when awake) for cough/wheezing.   You have been prescribed a prescription cough syrup, Phenergan-DM. Use at night to help with cough and sleeping.  Start daily zyrtec and flonase.    Hope you feel better soon.   Follow-up with family physician or urgent care in 4-5 days if symptoms not improving. Sooner with any worsening symptoms.  Acute Bronchitis, Adult  Acute bronchitis is sudden (acute) swelling of the air tubes (bronchi) in the lungs. Acute bronchitis causes these tubes to fill with mucus, which can make it hard to breathe. It can also cause coughing or wheezing. In adults, acute bronchitis usually goes away within 2 weeks. A cough caused by bronchitis may last up to 3 weeks. Smoking, allergies, and asthma can make the condition worse. Repeated episodes of bronchitis may cause further lung problems, such as chronic obstructive pulmonary disease (COPD). What are the causes? This condition can be caused by germs and by substances that irritate the lungs, including:  Cold and flu viruses. This condition is most often caused by the same virus that causes a cold.  Bacteria.  Exposure to tobacco smoke, dust, fumes, and air pollution. What increases the risk? This condition is more likely to develop in people who:  Have close contact with someone with acute bronchitis.  Are exposed  to lung irritants, such as tobacco smoke, dust, fumes, and vapors.  Have a weak immune system.  Have a respiratory condition such as asthma. What are the signs or symptoms? Symptoms of this condition include:  A cough.  Coughing up clear, yellow, or green mucus.  Wheezing.  Chest congestion.  Shortness of breath.  A fever.  Body aches.  Chills.  A sore throat. How is this diagnosed? This condition is usually diagnosed with a physical exam. During the exam, your health care provider may order tests, such as chest X-rays, to rule out other conditions. He or she may also:  Test a sample of your mucus for bacterial infection.  Check the level of oxygen in your blood. This is done to check for pneumonia.  Do a chest X-ray or lung function testing to rule out pneumonia and other conditions.  Perform blood tests. Your health care provider will also ask about your symptoms and medical history. How is this treated? Most cases of acute bronchitis clear up over time without treatment. Your health care provider may recommend:  Drinking more fluids. Drinking more makes your mucus thinner, which may make it easier to breathe.  Taking a medicine for a fever or cough.  Taking an antibiotic medicine.  Using an inhaler to help improve shortness of breath and to control a cough.  Using a cool mist vaporizer or humidifier to make it easier to breathe. Follow these instructions at home: Medicines  Take over-the-counter and prescription medicines only as told by your health care provider.  If you were prescribed  an antibiotic, take it as told by your health care provider. Do not stop taking the antibiotic even if you start to feel better. General instructions   Get plenty of rest.  Drink enough fluids to keep your urine pale yellow.  Avoid smoking and secondhand smoke. Exposure to cigarette smoke or irritating chemicals will make bronchitis worse. If you smoke and you need help  quitting, ask your health care provider. Quitting smoking will help your lungs heal faster.  Use an inhaler, cool mist vaporizer, or humidifier as told by your health care provider.  Keep all follow-up visits as told by your health care provider. This is important. How is this prevented? To lower your risk of getting this condition again:  Wash your hands often with soap and water. If soap and water are not available, use hand sanitizer.  Avoid contact with people who have cold symptoms.  Try not to touch your hands to your mouth, nose, or eyes.  Make sure to get the flu shot every year. Contact a health care provider if:  Your symptoms do not improve in 2 weeks of treatment. Get help right away if:  You cough up blood.  You have chest pain.  You have severe shortness of breath.  You become dehydrated.  You faint or keep feeling like you are going to faint.  You keep vomiting.  You have a severe headache.  Your fever or chills gets worse. This information is not intended to replace advice given to you by your health care provider. Make sure you discuss any questions you have with your health care provider. Document Released: 10/16/2004 Document Revised: 04/22/2017 Document Reviewed: 02/27/2016 Elsevier Interactive Patient Education  2019 [employer].

## 2018-10-28 NOTE — Progress Notes (Signed)
MRN: [id number] DOB: Dec 21, 1995  Subjective:   Andrea Osborne is a 23 y.o. female presenting for chief complaint of cough, headache and sore throat (x2days (dayquil, tea and cough drops)) .  Reports 2 day history of dry cough, chest congestion, wheezing, sore throat, and drainage. Has some wheezing at night time. Will have some chest tightness when she is coughing. Denies rhinorrhea, inability to swallow, chest pain and myalgia, nausea, vomiting, abdominal pain and diarrhea. Has tried dayquil and mucinex with some relief. Has PMH of asthma-mostly irritant and seasonal allergy induced- has been out of qvar for ~ 2 years, out of albuterol inhaler for the past year.  .Is also out of zyrtec and flonase. Denies smoking. Cycles are irregular. No concern for pregnancy. Not currently sexually active. Denies any other aggravating or relieving factors, no other questions or concerns.  Review of Systems  Constitutional: Negative for diaphoresis.  HENT: Negative for ear pain.   Respiratory: Negative for hemoptysis.   Musculoskeletal: Negative for neck pain.  Skin: Negative for rash.  Neurological: Negative for dizziness.    Andrea Osborne has a current medication list which includes the following prescription(s): albuterol, cetirizine, ciprofloxacin, epinephrine, and polyethylene glycol. Also is allergic to wheat bran and shellfish allergy.  Andrea Osborne  has a past medical history of Asthma and Constipation. Also  has no past surgical history on file.   Objective:   Vitals: BP 110/78   Pulse 79   Temp 98.7 F (37.1 C)   Resp 14   Wt 281 lb (127.5 kg)   SpO2 99%   BMI 46.76 kg/m   Physical Exam Vitals signs reviewed.  Constitutional:      General: She is not in acute distress.    Appearance: She is well-developed. She is not ill-appearing or toxic-appearing.  HENT:     Head: Normocephalic and atraumatic.     Right Ear: Tympanic membrane, ear canal and external ear normal.     Left Ear:  Tympanic membrane, ear canal and external ear normal.     Nose: Mucosal edema and congestion present.     Right Sinus: No maxillary sinus tenderness or frontal sinus tenderness.     Left Sinus: No maxillary sinus tenderness or frontal sinus tenderness.     Mouth/Throat:     Lips: Pink.     Mouth: Mucous membranes are moist.     Pharynx: Uvula midline. Posterior oropharyngeal erythema present.     Tonsils: No tonsillar exudate or tonsillar abscesses. Swelling: 1+ on the right. 1+ on the left.  Eyes:     Conjunctiva/sclera: Conjunctivae normal.  Neck:     Musculoskeletal: Normal range of motion.  Cardiovascular:     Rate and Rhythm: Normal rate and regular rhythm.     Heart sounds: Normal heart sounds.  Pulmonary:     Effort: Pulmonary effort is normal. No accessory muscle usage.     Breath sounds: Wheezing (one faint wheeze auscultated in posterior RMF with force expiratory breathing) present. No decreased breath sounds, rhonchi or rales.     Comments: Frequent deep coughing noted during exam.  Lymphadenopathy:     Head:     Right side of head: No submental, submandibular, tonsillar, preauricular, posterior auricular or occipital adenopathy.     Left side of head: No submental, submandibular, tonsillar, preauricular, posterior auricular or occipital adenopathy.     Cervical: No cervical adenopathy.     Upper Body:     Right upper body: No supraclavicular adenopathy.  Left upper body: No supraclavicular adenopathy.  Skin:    General: Skin is warm and dry.  Neurological:     Mental Status: She is alert.     No results found for this or any previous visit (from the past 24 hour(s)).  Assessment and Plan :  1. Bronchitis Hx and PE findings consistent with bronchitis w/ intermittent bronchospasm. Pt has hx of asthma (has not been on meds in ~2 years), one faint wheeze auscultated on exam with forced expiratory breathing. SpO2 99%. She is overall well appearing, NAD. Concern for  early developing asthma exacerbation. Will treat w/ oral prednisone, albuterol inhaler, cough syrup as needed, and restart her on her allergy medications. No concern for bacterial etiology at this time. Advised to f/u with family doctor if no improvement in 5-7 days. Seek care sooner if sx worsen/develop new concerning sx with tx plan.  - albuterol (PROVENTIL HFA;VENTOLIN HFA) 108 (90 Base) MCG/ACT inhaler; Inhale 2 puffs into the lungs every 4 (four) hours as needed for wheezing or shortness of breath (cough, shortness of breath or wheezing.).  Dispense: 1 Inhaler; Refill: 0 - predniSONE (DELTASONE) 20 MG tablet; Take 2 tablets (40 mg total) by mouth daily with breakfast for 5 days.  Dispense: 10 tablet; Refill: 0 - promethazine-dextromethorphan (PROMETHAZINE-DM) 6.25-15 MG/5ML syrup; Take 5 mLs by mouth 4 (four) times daily as needed for cough.  Dispense: 118 mL; Refill: 0  2. Seasonal allergies - fluticasone (FLONASE) 50 MCG/ACT nasal spray; Place 2 sprays into both nostrils daily.  Dispense: 16 g; Refill: 0 - cetirizine (ZYRTEC) 10 MG tablet; Take 1 tablet (10 mg total) by mouth daily.  Dispense: 30 tablet; Refill: 0   Benjiman Core, PA-C  [facility] 10/28/2018 11:52 AM

## 2018-11-13 ENCOUNTER — Emergency Department (HOSPITAL_COMMUNITY)
Admission: EM | Admit: 2018-11-13 | Discharge: 2018-11-13 | Disposition: A | Discharge status: Home / Self Care | Payer: Self-pay | Attending: Emergency Medicine | Admitting: Emergency Medicine

## 2018-11-13 ENCOUNTER — Other Ambulatory Visit: Payer: Self-pay

## 2018-11-13 ENCOUNTER — Encounter (HOSPITAL_COMMUNITY): Payer: Self-pay | Admitting: Emergency Medicine

## 2018-11-13 ENCOUNTER — Emergency Department (HOSPITAL_COMMUNITY): Payer: Self-pay

## 2018-11-13 DIAGNOSIS — Z79899 Other long term (current) drug therapy: Secondary | ICD-10-CM

## 2018-11-13 DIAGNOSIS — R0789 Other chest pain: Principal | ICD-10-CM

## 2018-11-13 DIAGNOSIS — J45909 Unspecified asthma, uncomplicated: Secondary | ICD-10-CM

## 2018-11-13 DIAGNOSIS — Z733 Stress, not elsewhere classified: Secondary | ICD-10-CM

## 2018-11-13 LAB — BASIC METABOLIC PANEL
ANION GAP: 11 (ref 5–15)
BUN: <5 mg/dL — ABNORMAL LOW (ref 6–20)
CALCIUM: 9.1 mg/dL (ref 8.9–10.3)
CO2: 24 mmol/L (ref 22–32)
Chloride: 105 mmol/L (ref 98–111)
Creatinine, Ser: 1 mg/dL (ref 0.44–1.00)
GFR calc non Af Amer: >60 mL/min (ref >60)
Glucose, Bld: 86 mg/dL (ref 70–99)
Potassium: 4.1 mmol/L (ref 3.5–5.1)
Sodium: 140 mmol/L (ref 135–145)

## 2018-11-13 LAB — CBC
HCT: 40.6 % (ref 36.0–46.0)
Hemoglobin: 12.4 g/dL (ref 12.0–15.0)
MCH: 27 pg (ref 26.0–34.0)
MCHC: 30.5 g/dL (ref 30.0–36.0)
MCV: 88.3 fL (ref 80.0–100.0)
NRBC: 0 % (ref 0.0–0.2)
PLATELETS: 264 10*3/uL (ref 150–400)
RBC: 4.6 MIL/uL (ref 3.87–5.11)
RDW: 14.6 % (ref 11.5–15.5)
WBC: 7.6 10*3/uL (ref 4.0–10.5)

## 2018-11-13 LAB — I-STAT TROPONIN, ED: TROPONIN I, POC: 0 ng/mL (ref 0.00–0.08)

## 2018-11-13 NOTE — ED Triage Notes (Signed)
Pt coming from EMS with complaints of cp that began at work tonight. Works at [employer]. Pain at center of left breast and radiates down left arm. Pt alert and oriented and vitals stable. Given Aspirin 324 in route. EKG negative

## 2018-11-13 NOTE — ED Provider Notes (Signed)
[facility] EMERGENCY DEPARTMENT Provider Note   CSN: [id number] Arrival date & time: 11/13/18  2778    History   Chief Complaint Chief Complaint  Patient presents with  . Chest Pain    HPI Andrea Osborne is a 23 y.o. female.     The history is provided by the patient. No language interpreter was used.  Chest Pain     23 year old female with history of asthma presents to the ED via EMS from work for evaluation of chest pain.  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  An hour into her job this morning she developed discomfort to her chest.  She described as a tightness sharp sensation to the left side of her chest, with tingling sensation to her left shoulder, left hand, and feet.  Pain is gradual onset, crescendo, intensified over the course of 2 to 3 hours but has since improved.  There is no associated lightheadedness, dizziness, shortness of breath, nausea, diaphoresis.  Pain is nonexertional.  EMS was contacted and patient received aspirin and initial EKG was unremarkable.  At this time, her pain is mostly resolved.  She denies any significant cardiac history or family history of cardiac disease.  She is not a smoker.  Social drinker.  She admits to having increasing stress with school.  She denies any recent strenuous activity.  Past Medical History:  Diagnosis Date  . Asthma   . Constipation     There are no active problems to display for this patient.   No past surgical history on file.   OB History   No obstetric history on file.      Home Medications    Prior to Admission medications   Medication Sig Start Date End Date Taking? Authorizing Provider  albuterol (PROVENTIL HFA;VENTOLIN HFA) 108 (90 Base) MCG/ACT inhaler Inhale 2 puffs into the lungs every 4 (four) hours as needed for wheezing or shortness of breath (cough, shortness of breath or wheezing.). 10/28/18   Benjiman Core D, PA-C  cetirizine (ZYRTEC) 10 MG  tablet Take 1 tablet (10 mg total) by mouth daily. 10/28/18   Benjiman Core D, PA-C  ciprofloxacin (CIPRO) 500 MG tablet Take 1 tablet (500 mg total) by mouth 2 (two) times daily. 09/21/16   Deborha Payment, PA-C  EPINEPHrine 0.3 mg/0.3 mL IJ SOAJ injection Inject 0.3 mg into the muscle daily as needed (allergic reaction).    [provider]  fluticasone (FLONASE) 50 MCG/ACT nasal spray Place 2 sprays into both nostrils daily. 10/28/18   Benjiman Core D, PA-C  polyethylene glycol (MIRALAX / GLYCOLAX) packet Take 17 g by mouth daily. 08/10/15   Fayrene Helper, PA-C  promethazine-dextromethorphan (PROMETHAZINE-DM) 6.25-15 MG/5ML syrup Take 5 mLs by mouth 4 (four) times daily as needed for cough. 10/28/18   Magdalene River, PA-C    Family History No family history on file.  Social History Social History   Tobacco Use  . Smoking status: Never Smoker  . Smokeless tobacco: Never Used  Substance Use Topics  . Alcohol use: No  . Drug use: No     Allergies   Wheat bran and Shellfish allergy   Review of Systems Review of Systems  Cardiovascular: Positive for chest pain.  All other systems reviewed and are negative.    Physical Exam Updated Vital Signs BP 132/89 (BP Location: Right Arm)   Pulse 87   Temp 97.8 F (36.6 C) (Oral)   Resp 18  Ht  (1.651 m)   Wt 124.7 kg   SpO2 100%   BMI 45.76 kg/m   Physical Exam Vitals signs and nursing note reviewed.  Constitutional:      General: She is not in acute distress.    Appearance: She is well-developed.  HENT:     Head: Atraumatic.  Eyes:     Conjunctiva/sclera: Conjunctivae normal.  Neck:     Musculoskeletal: Neck supple.  Cardiovascular:     Rate and Rhythm: Normal rate and regular rhythm.     Pulses: Normal pulses.     Heart sounds: Normal heart sounds.  Pulmonary:     Breath sounds: Normal breath sounds.  Abdominal:     Palpations: Abdomen is soft.     Tenderness: There is no abdominal tenderness.   Skin:    Capillary Refill: Capillary refill takes less than 2 seconds.     Findings: No rash.  Neurological:     Mental Status: She is alert and oriented to person, place, and time.      ED Treatments / Results  Labs (all labs ordered are listed, but only abnormal results are displayed) Labs Reviewed  BASIC METABOLIC PANEL - Abnormal; Notable for the following components:      Result Value   BUN <5 (*)    All other components within normal limits  CBC  I-STAT TROPONIN, ED    EKG None   Date: 11/13/2018  Rate: 75  Rhythm: normal sinus rhythm  QRS Axis: normal  Intervals: normal  ST/T Wave abnormalities: normal  Conduction Disutrbances: none  Narrative Interpretation:   Old EKG Reviewed: No significant changes noted     Radiology Dg Chest Port 1 View  Result Date: 11/13/2018 CLINICAL DATA:  Acute LEFT chest pain for 1 day. EXAM: PORTABLE CHEST 1 VIEW COMPARISON:  09/21/2016 and prior radiographs FINDINGS: The cardiomediastinal silhouette is unremarkable. There is no evidence of focal airspace disease, pulmonary edema, suspicious pulmonary nodule/mass, pleural effusion, or pneumothorax. No acute bony abnormalities are identified. IMPRESSION: No active disease. Electronically Signed   By: Harmon Pier M.D.   On: 11/13/2018 07:44    Procedures Procedures (including critical care time)  Medications Ordered in ED Medications - No data to display   Initial Impression / Assessment and Plan / ED Course  I have reviewed the triage vital signs and the nursing notes.  Pertinent labs & imaging results that were available during my care of the patient were reviewed by me and considered in my medical decision making (see chart for details).        BP 122/81   Pulse 72   Temp 97.8 F (36.6 C) (Oral)   Resp 16   Ht  (1.651 m)   Wt 124.7 kg   LMP 11/02/2018 (Within Days)   SpO2 99%   BMI 45.76 kg/m    Final Clinical Impressions(s) / ED Diagnoses   Final  diagnoses:  Atypical chest pain    ED Discharge Orders    None     7:24 AM Patient here with chest pain atypical of ACS.  Pain has mostly resolved.  Suspect pain is likely secondary to anxiety and stress, less likely to be a life-threatening medical condition.  Work-up initiated.  8:44 AM Chest x-ray unremarkable, normal EKG, normal troponin, normal H&H, normal WBC, nomal electrolytes panel.  sxs resolved.  Stable for discharge.  Return precaution discussed.  HEART score 0.   Fayrene Helper, PA-C 11/13/18 [phone]  Cathren Laine, MD 11/13/18 [phone]

## 2019-04-30 ENCOUNTER — Encounter (HOSPITAL_BASED_OUTPATIENT_CLINIC_OR_DEPARTMENT_OTHER): Payer: Self-pay | Admitting: Adult Health

## 2019-04-30 ENCOUNTER — Emergency Department (HOSPITAL_BASED_OUTPATIENT_CLINIC_OR_DEPARTMENT_OTHER): Payer: Managed Care, Other (non HMO)

## 2019-04-30 ENCOUNTER — Other Ambulatory Visit: Payer: Self-pay

## 2019-04-30 ENCOUNTER — Emergency Department (HOSPITAL_BASED_OUTPATIENT_CLINIC_OR_DEPARTMENT_OTHER)
Admission: EM | Admit: 2019-04-30 | Discharge: 2019-04-30 | Disposition: A | Discharge status: Home / Self Care | Payer: Managed Care, Other (non HMO) | Attending: Emergency Medicine | Admitting: Emergency Medicine

## 2019-04-30 DIAGNOSIS — M79674 Pain in right toe(s): Secondary | ICD-10-CM | POA: Insufficient documentation

## 2019-04-30 DIAGNOSIS — J45909 Unspecified asthma, uncomplicated: Secondary | ICD-10-CM | POA: Insufficient documentation

## 2019-04-30 DIAGNOSIS — Z79899 Other long term (current) drug therapy: Secondary | ICD-10-CM | POA: Diagnosis not present

## 2019-04-30 DIAGNOSIS — S99921A Unspecified injury of right foot, initial encounter: Secondary | ICD-10-CM

## 2019-04-30 MED ORDER — CEPHALEXIN 500 MG PO CAPS: 500.0000 mg | ORAL_CAPSULE | Freq: Two times a day (BID) | ORAL | 0 refills | Status: AC

## 2019-04-30 NOTE — ED Notes (Signed)
ED Provider at bedside. Dr. Long 

## 2019-04-30 NOTE — Discharge Instructions (Addendum)
Your xray today showed no fractures or dislocations.  I prescribed a short course of antibiotics to help prevent any infection to your foot.  The number to podiatry is attached to your chart, please call to schedule an appointment.  If the pain worsens, there is more drainage from the wound please return to the emergency department.

## 2019-04-30 NOTE — ED Triage Notes (Signed)
Presents with right great toe injury, occurred 2 days ago.

## 2019-04-30 NOTE — ED Provider Notes (Signed)
[facility] EMERGENCY DEPARTMENT Provider Note   CSN: [id number] Arrival date & time: 04/30/19  1419    History   Chief Complaint Chief Complaint  Patient presents with  . Toe Injury    HPI Andrea Osborne is a 23 y.o. female.     23 y.o female with a PMH of Asthma, constipation presents to the ED with a chief complaint of right great toe injury x 3 days ago. Patient reports she tripped hurting her right great toe , since this episode patient has experienced pain to the toe, small drainage, bleeding has been present.  Patient has been applying warm soaks to her foot without improvement in symptoms.  She reports most of the pain is usually at night, along with worse with ambulation.  She denies any fevers, other injuries.  The history is provided by the patient.    Past Medical History:  Diagnosis Date  . Asthma   . Constipation     There are no active problems to display for this patient.   History reviewed. No pertinent surgical history.   OB History   No obstetric history on file.      Home Medications    Prior to Admission medications   Medication Sig Start Date End Date Taking? Authorizing Provider  albuterol (PROVENTIL HFA;VENTOLIN HFA) 108 (90 Base) MCG/ACT inhaler Inhale 2 puffs into the lungs every 4 (four) hours as needed for wheezing or shortness of breath (cough, shortness of breath or wheezing.). 10/28/18   Benjiman CoreWiseman, Brittany D, PA-C  cetirizine (ZYRTEC) 10 MG tablet Take 1 tablet (10 mg total) by mouth daily. 10/28/18   Benjiman CoreWiseman, Brittany D, PA-C  ciprofloxacin (CIPRO) 500 MG tablet Take 1 tablet (500 mg total) by mouth 2 (two) times daily. 09/21/16   Deborha PaymentMeyer, Ashley L, PA-C  EPINEPHrine 0.3 mg/0.3 mL IJ SOAJ injection Inject 0.3 mg into the muscle daily as needed (allergic reaction).    [provider]  fluticasone (FLONASE) 50 MCG/ACT nasal spray Place 2 sprays into both nostrils daily. 10/28/18   Benjiman CoreWiseman, Brittany D, PA-C  polyethylene  glycol (MIRALAX / GLYCOLAX) packet Take 17 g by mouth daily. 08/10/15   Fayrene Helperran, [place], PA-C  promethazine-dextromethorphan (PROMETHAZINE-DM) 6.25-15 MG/5ML syrup Take 5 mLs by mouth 4 (four) times daily as needed for cough. 10/28/18   Magdalene RiverWiseman, Brittany D, PA-C    Family History History reviewed. No pertinent family history.  Social History Social History   Tobacco Use  . Smoking status: Never Smoker  . Smokeless tobacco: Never Used  Substance Use Topics  . Alcohol use: No  . Drug use: No     Allergies   Wheat bran and Shellfish allergy   Review of Systems Review of Systems  Constitutional: Negative for fever.  Skin: Positive for wound.     Physical Exam Updated Vital Signs BP 126/75 (BP Location: Right Arm)   Pulse (!) 103   Temp 98.5 F (36.9 C) (Oral)   Resp 18   Ht 5\' 5"  (1.651 m)   Wt 131.5 kg   LMP 03/14/2019   SpO2 100%   BMI 48.26 kg/m   Physical Exam Vitals signs and nursing note reviewed.  Constitutional:      General: She is not in acute distress.    Appearance: Normal appearance. She is well-developed. She is obese.  HENT:     Head: Normocephalic and atraumatic.     Mouth/Throat:     Pharynx: No oropharyngeal exudate.  Eyes:  Pupils: Pupils are equal, round, and reactive to light.  Neck:     Musculoskeletal: Normal range of motion.  Cardiovascular:     Rate and Rhythm: Regular rhythm.     Heart sounds: Normal heart sounds.  Pulmonary:     Effort: Pulmonary effort is normal. No respiratory distress.  Abdominal:     General: There is no distension.     Palpations: Abdomen is soft.     Tenderness: There is no abdominal tenderness.  Musculoskeletal:        General: No tenderness or deformity.     Right lower leg: No edema.     Left lower leg: No edema.  Feet:     Right foot:     Toenail Condition: Right toenails are long.     Comments: Patient's toenail appears attached to the nail bed, some drainage under her nail, minimal bleeding  noted. Skin:    General: Skin is warm and dry.  Neurological:     Mental Status: She is alert and oriented to person, place, and time.      ED Treatments / Results  Labs (all labs ordered are listed, but only abnormal results are displayed) Labs Reviewed - No data to display  EKG None  Radiology No results found.  Procedures Procedures (including critical care time)  Medications Ordered in ED Medications - No data to display   Initial Impression / Assessment and Plan / ED Course  I have reviewed the triage vital signs and the nursing notes.  Pertinent labs & imaging results that were available during my care of the patient were reviewed by me and considered in my medical decision making (see chart for details).      Patient with no past medical history presents the ED with complaints of right great toe injury about 3 days ago.  Reports some drainage from the wound, states pain is worse at night along with ambulation.  During primary evaluation total appears within normal limits, no obvious deformity noted, small amount of drainage noted under toenail, bleeding is controlled time.  Will obtain x-ray to further evaluate patient's condition. X-ray of the right foot showed No fracture or foreign body identified.  I have discussed patient with my attending Dr. Laverta Baltimore, feel as though patient would be appropriate to follow-up with podiatry.  Risks and benefits of nail removal were discussed in the ED, patient prefers to have nail fall off on its own.  We will give her a referral to follow-up with podiatry on an outpatient basis, due to injury along with area of the wound will place patient on antibiotics feel as though Keflex will be helpful for prophylactic of preventing any further infection.  Strict return precautions were given to patient.  Return precautions provided at length.   Portions of this note were generated with [employer]. Dictation errors may occur  despite best attempts at proofreading.  Final Clinical Impressions(s) / ED Diagnoses   Final diagnoses:  Toe injury, right, initial encounter    ED Discharge Orders    None       Janeece Fitting, Hershal Coria 04/30/19 1532    Margette Fast, MD 04/30/19 2040

## 2019-04-30 NOTE — ED Notes (Signed)
ED Provider at bedside. 

## 2019-04-30 NOTE — ED Notes (Signed)
Patient transported to X-ray 

## 2019-07-08 ENCOUNTER — Encounter (HOSPITAL_BASED_OUTPATIENT_CLINIC_OR_DEPARTMENT_OTHER): Payer: Self-pay | Admitting: Emergency Medicine

## 2020-10-08 IMAGING — DX RIGHT FOOT COMPLETE - 3+ VIEW
3 series · 3 of 3 positions shown · non-contrast
Comparison: None.

CLINICAL DATA: Patient stubbed right great toenail 2 days ago.
Pain.

EXAM:
RIGHT FOOT COMPLETE - 3+ VIEW

[foot ap]
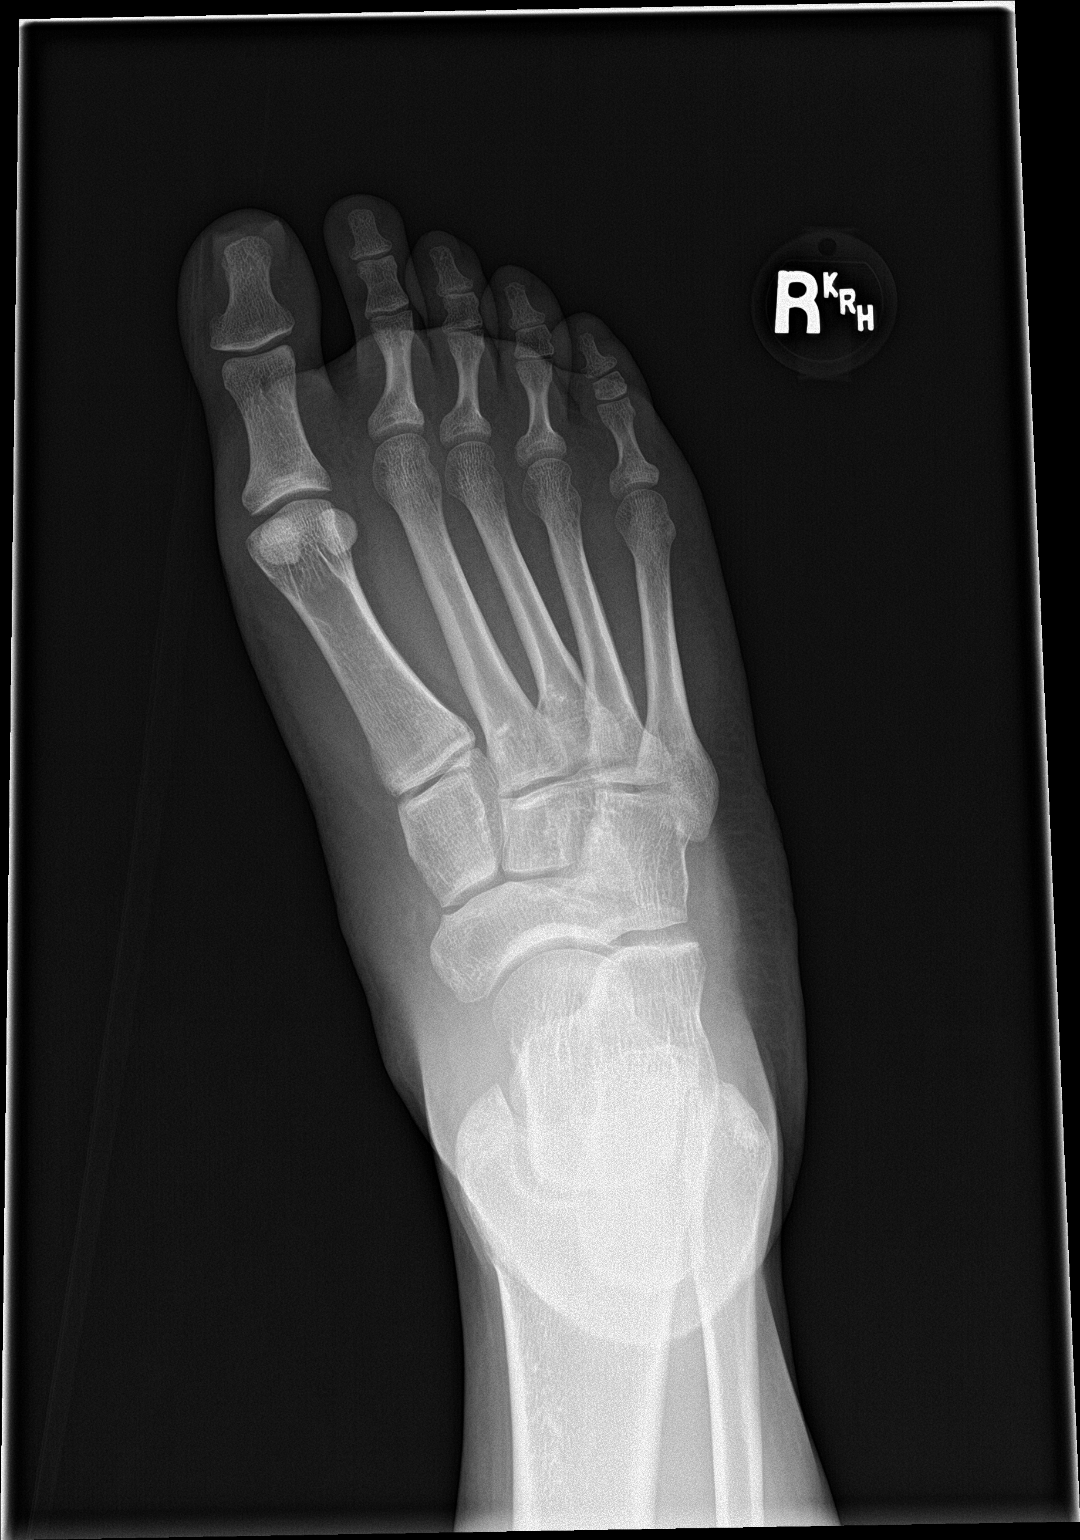

[foot obl]
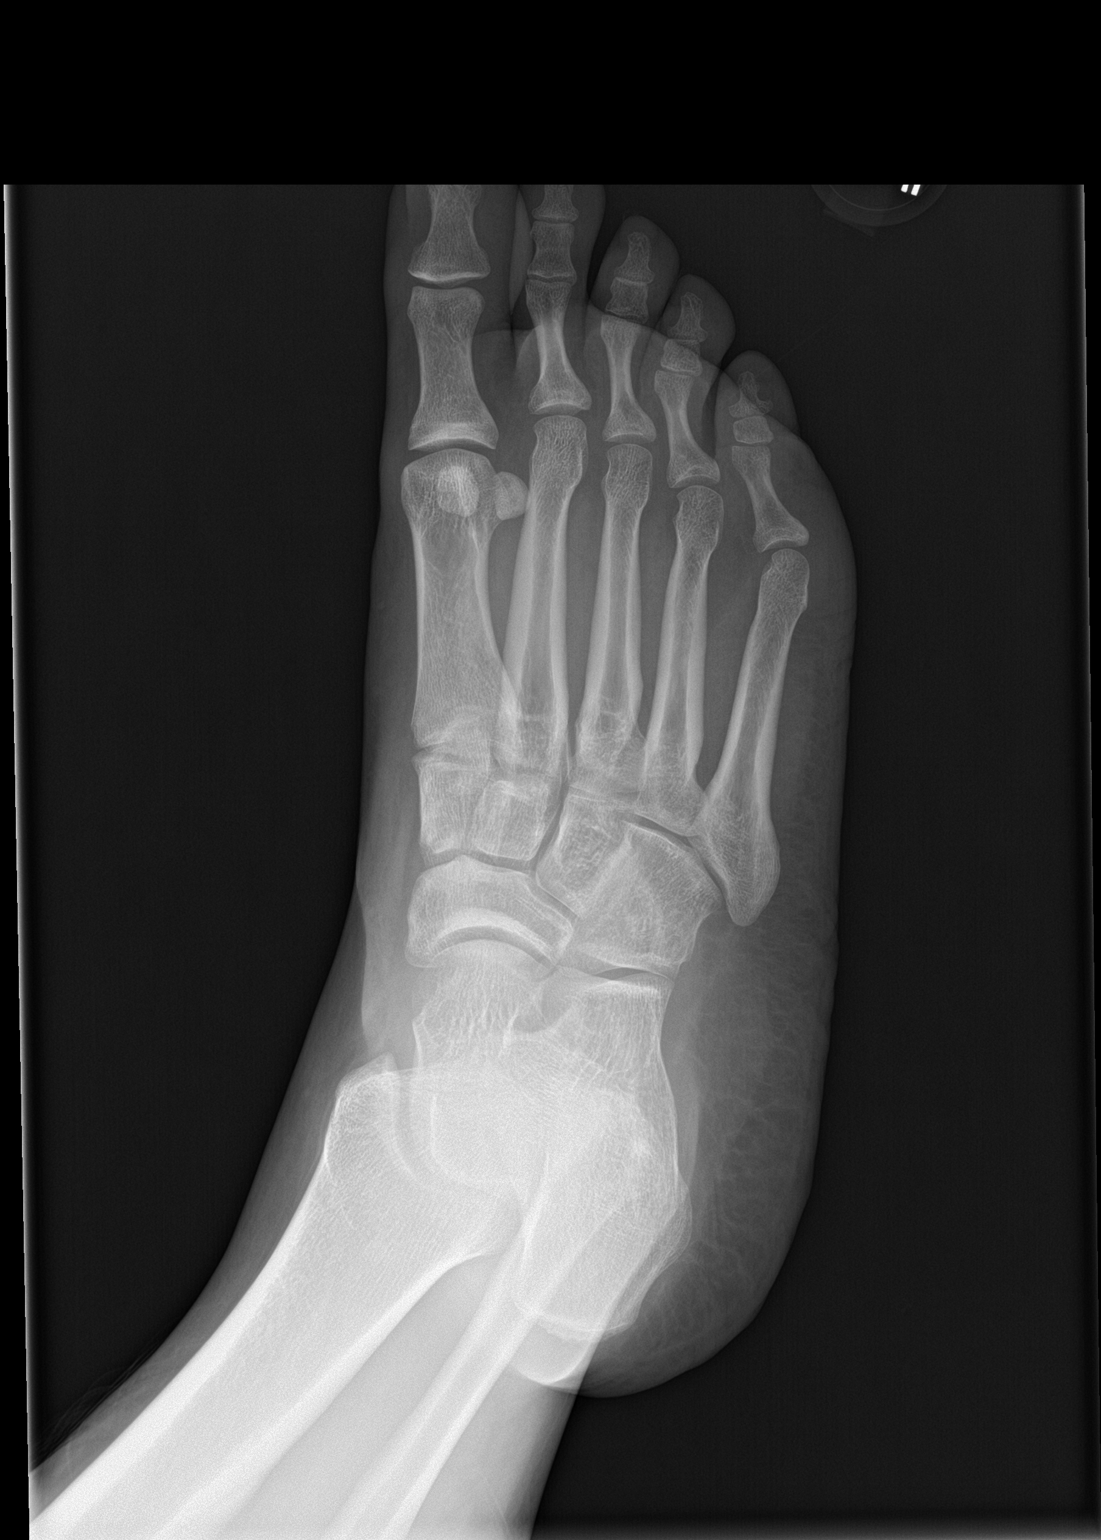

[foot lat]
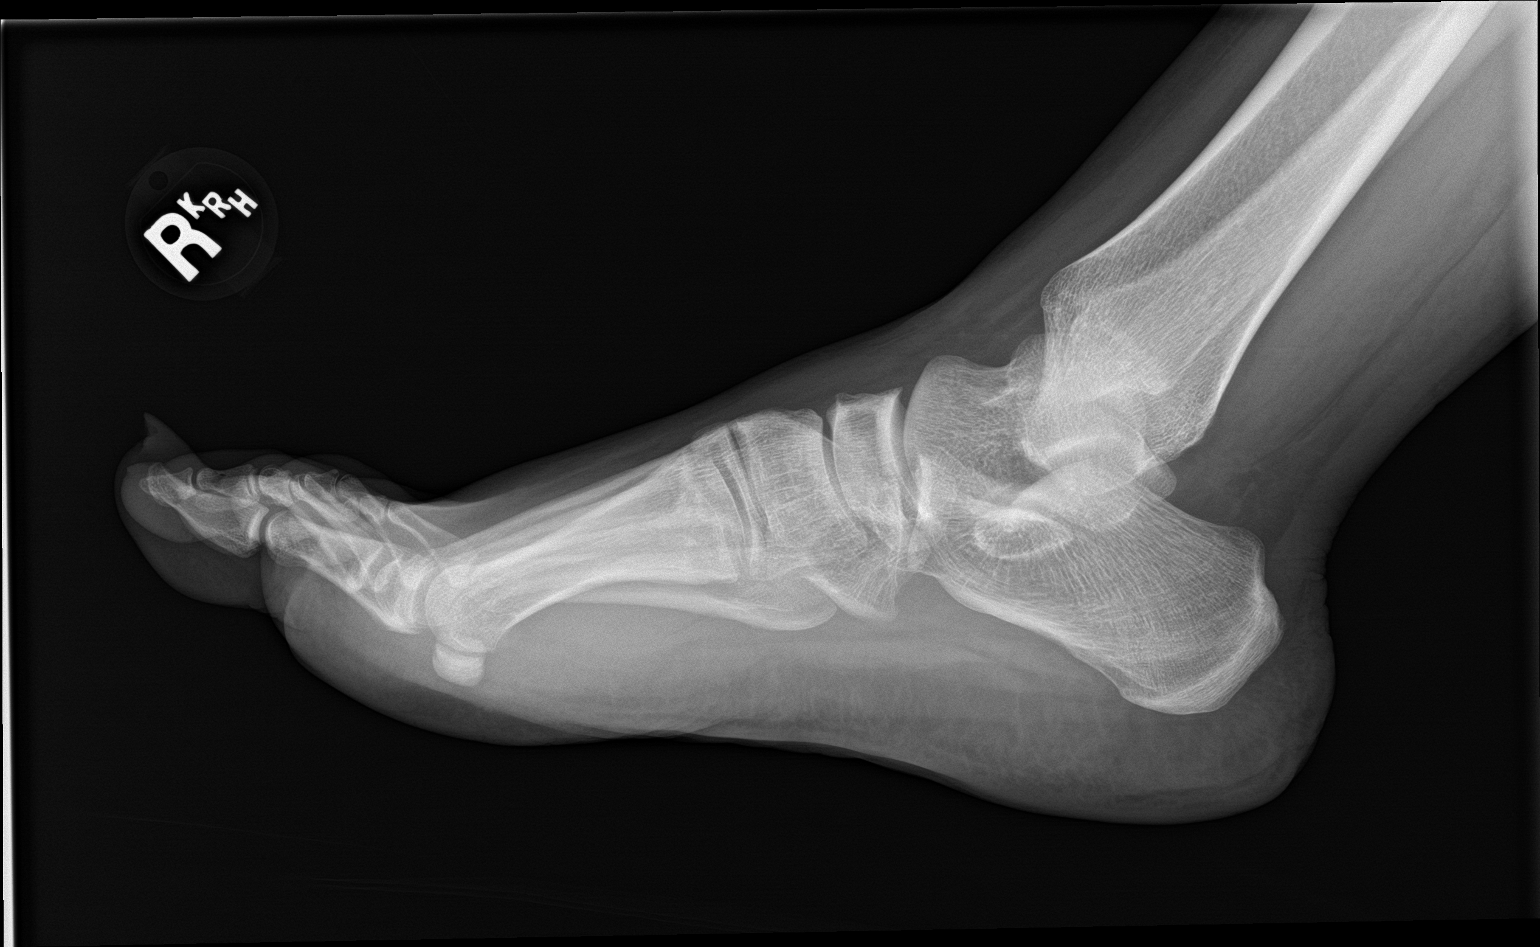

[3 of 3 positions shown; findings below may reference images not displayed]

FINDINGS: There is no evidence of fracture or dislocation. There is no
evidence of arthropathy or other focal bone abnormality. Soft
tissues are unremarkable.
IMPRESSION: No fracture or foreign body identified.
